# Patient Record
Sex: Female | Born: 1980 | Race: Black or African American | Hispanic: No | Marital: Single
Health system: Southern US, Community
[De-identification: ages and names within clinical notes are randomized; demographics above are authoritative.]

---

## 2013-03-04 ENCOUNTER — Emergency Department: Payer: Self-pay | Admitting: Emergency Medicine

## 2013-03-04 LAB — BASIC METABOLIC PANEL
Anion Gap: 5 — ABNORMAL LOW (ref 7–16)
Calcium, Total: 9.1 mg/dL (ref 8.5–10.1)
Chloride: 103 mmol/L (ref 98–107)
Co2: 28 mmol/L (ref 21–32)
Creatinine: 0.54 mg/dL — ABNORMAL LOW (ref 0.60–1.30)
Osmolality: 271 (ref 275–301)
Sodium: 136 mmol/L (ref 136–145)

## 2013-03-04 LAB — CBC
HCT: 36.1 % (ref 35.0–47.0)
MCH: 26.2 pg (ref 26.0–34.0)
MCV: 80 fL (ref 80–100)
Platelet: 189 10*3/uL (ref 150–440)

## 2013-03-04 LAB — HCG, QUANTITATIVE, PREGNANCY: Beta Hcg, Quant.: 36971 m[IU]/mL — ABNORMAL HIGH

## 2013-03-04 LAB — URINALYSIS, COMPLETE
Bacteria: NONE SEEN
Ketone: NEGATIVE
Leukocyte Esterase: NEGATIVE
Protein: NEGATIVE
RBC,UR: 2 /HPF (ref 0–5)
Squamous Epithelial: 9

## 2013-03-04 LAB — WET PREP, GENITAL

## 2013-03-04 LAB — GC/CHLAMYDIA PROBE AMP

## 2013-03-09 ENCOUNTER — Emergency Department: Payer: Self-pay | Admitting: Emergency Medicine

## 2013-03-09 LAB — URINALYSIS, COMPLETE
Blood: NEGATIVE
Ketone: NEGATIVE
Ph: 5 (ref 4.5–8.0)
RBC,UR: <1 /HPF (ref 0–5)
Squamous Epithelial: 6
WBC UR: <1 /HPF (ref 0–5)

## 2013-03-09 LAB — HCG, QUANTITATIVE, PREGNANCY: Beta Hcg, Quant.: 64517 m[IU]/mL — ABNORMAL HIGH

## 2013-03-15 ENCOUNTER — Emergency Department: Payer: Self-pay | Admitting: Emergency Medicine

## 2013-03-15 LAB — URINALYSIS, COMPLETE
Bacteria: NONE SEEN
Ph: 6 (ref 4.5–8.0)
RBC,UR: 1 /HPF (ref 0–5)
Squamous Epithelial: 8
WBC UR: 1 /HPF (ref 0–5)

## 2013-03-15 LAB — CBC WITH DIFFERENTIAL/PLATELET
Basophil #: 0 10*3/uL (ref 0.0–0.1)
Eosinophil #: 0.2 10*3/uL (ref 0.0–0.7)
Eosinophil %: 3.9 %
HCT: 39.5 % (ref 35.0–47.0)
Lymphocyte #: 1.5 10*3/uL (ref 1.0–3.6)
Lymphocyte %: 26.6 %
MCH: 26.6 pg (ref 26.0–34.0)
MCHC: 33.2 g/dL (ref 32.0–36.0)
Monocyte #: 0.4 x10 3/mm (ref 0.2–0.9)
Monocyte %: 7.3 %
Neutrophil #: 3.4 10*3/uL (ref 1.4–6.5)
Neutrophil %: 61.7 %
Platelet: 211 10*3/uL (ref 150–440)
RDW: 13 % (ref 11.5–14.5)
WBC: 5.5 10*3/uL (ref 3.6–11.0)

## 2013-03-15 LAB — COMPREHENSIVE METABOLIC PANEL
Anion Gap: 6 — ABNORMAL LOW (ref 7–16)
BUN: 11 mg/dL (ref 7–18)
Calcium, Total: 9.7 mg/dL (ref 8.5–10.1)
Chloride: 100 mmol/L (ref 98–107)
Co2: 26 mmol/L (ref 21–32)
Creatinine: 0.57 mg/dL — ABNORMAL LOW (ref 0.60–1.30)
EGFR (African American): >60
Glucose: 75 mg/dL (ref 65–99)
Potassium: 3.7 mmol/L (ref 3.5–5.1)
SGOT(AST): 20 U/L (ref 15–37)
SGPT (ALT): 25 U/L (ref 12–78)
Sodium: 132 mmol/L — ABNORMAL LOW (ref 136–145)
Total Protein: 8.7 g/dL — ABNORMAL HIGH (ref 6.4–8.2)

## 2013-04-27 ENCOUNTER — Emergency Department: Payer: Self-pay | Admitting: Emergency Medicine

## 2013-04-27 LAB — URINALYSIS, COMPLETE
BACTERIA: NONE SEEN
Bilirubin,UR: NEGATIVE
Glucose,UR: NEGATIVE mg/dL (ref 0–75)
Ketone: NEGATIVE
Leukocyte Esterase: NEGATIVE
Nitrite: NEGATIVE
Ph: 6 (ref 4.5–8.0)
Protein: NEGATIVE
RBC,UR: 159 /HPF (ref 0–5)
Specific Gravity: 1.029 (ref 1.003–1.030)
Squamous Epithelial: <1

## 2013-04-27 LAB — CBC
HCT: 36.6 % (ref 35.0–47.0)
HGB: 11.9 g/dL — ABNORMAL LOW (ref 12.0–16.0)
MCH: 27.1 pg (ref 26.0–34.0)
MCHC: 32.6 g/dL (ref 32.0–36.0)
MCV: 83 fL (ref 80–100)
Platelet: 195 10*3/uL (ref 150–440)
RBC: 4.39 10*6/uL (ref 3.80–5.20)
RDW: 14.2 % (ref 11.5–14.5)
WBC: 5.4 10*3/uL (ref 3.6–11.0)

## 2013-04-27 LAB — HCG, QUANTITATIVE, PREGNANCY: BETA HCG, QUANT.: 2100 m[IU]/mL — AB

## 2013-04-30 ENCOUNTER — Ambulatory Visit: Payer: Self-pay | Admitting: Emergency Medicine

## 2013-04-30 LAB — CBC WITH DIFFERENTIAL/PLATELET
Basophil #: 0 10*3/uL (ref 0.0–0.1)
Basophil %: 0.6 %
Eosinophil #: 0.2 10*3/uL (ref 0.0–0.7)
Eosinophil %: 3.8 %
HCT: 34.9 % — ABNORMAL LOW (ref 35.0–47.0)
HGB: 11.6 g/dL — ABNORMAL LOW (ref 12.0–16.0)
Lymphocyte #: 1.4 10*3/uL (ref 1.0–3.6)
Lymphocyte %: 26.6 %
MCH: 27.7 pg (ref 26.0–34.0)
MCHC: 33.3 g/dL (ref 32.0–36.0)
MCV: 83 fL (ref 80–100)
Monocyte #: 0.4 x10 3/mm (ref 0.2–0.9)
Monocyte %: 7.6 %
Neutrophil #: 3.3 10*3/uL (ref 1.4–6.5)
Neutrophil %: 61.4 %
PLATELETS: 187 10*3/uL (ref 150–440)
RBC: 4.2 10*6/uL (ref 3.80–5.20)
RDW: 14.2 % (ref 11.5–14.5)
WBC: 5.4 10*3/uL (ref 3.6–11.0)

## 2013-04-30 LAB — BASIC METABOLIC PANEL
Anion Gap: 6 — ABNORMAL LOW (ref 7–16)
BUN: 13 mg/dL (ref 7–18)
CO2: 29 mmol/L (ref 21–32)
Calcium, Total: 9.1 mg/dL (ref 8.5–10.1)
Chloride: 103 mmol/L (ref 98–107)
Creatinine: 0.83 mg/dL (ref 0.60–1.30)
Glucose: 95 mg/dL (ref 65–99)
Osmolality: 276 (ref 275–301)
Potassium: 3.7 mmol/L (ref 3.5–5.1)
SODIUM: 138 mmol/L (ref 136–145)

## 2013-04-30 LAB — HCG, QUANTITATIVE, PREGNANCY: Beta Hcg, Quant.: 1460 m[IU]/mL — ABNORMAL HIGH

## 2013-05-05 LAB — PATHOLOGY REPORT

## 2013-08-13 ENCOUNTER — Emergency Department: Payer: Self-pay | Admitting: Emergency Medicine

## 2013-08-17 LAB — BETA STREP CULTURE(ARMC)

## 2014-07-09 NOTE — Op Note (Signed)
PATIENT NAME:  Jessica Hester, Jessica Hester MR#:  [id number] DATE OF BIRTH:  [birth year]  DATE OF PROCEDURE:  04/30/2013  PREOPERATIVE DIAGNOSIS: Incomplete abortion.   POSTOPERATIVE DIAGNOSIS:  Incomplete abortion.     OPERATION PERFORMED: Suction dilatation and curettage.   ANESTHESIA: Used general.   PREOPERATIVE ANTIBIOTICS: Doxycycline 100 mg.   DRAINS OR TUBES: None.   IMPLANTS: None.   ESTIMATED BLOOD LOSS: 50  mL.   OPERATIVE FLUIDS: 300 mL of crystalloid.   URINE OUTPUT: 15 mL of urine straight cath'd at the beginning of the case.   COMPLICATIONS: None.   SPECIMENS REMOVED: Products of conception.   INTRAOPERATIVE FINDINGS: Retroverted 8 week size uterus, size 8 flexible suction curette was used with good uterine cry noted throughout on sharp curettage.   CONDITION FOLLOWING PROCEDURE: Stable.   PROCEDURE IN DETAIL: Risks, benefits and alternatives of the procedure were discussed with the patient prior to proceeding to the operating room. The patient was taken to the operating room and was placed under general anesthesia. She was positioned in the dorsal lithotomy position using Allen stirrups, prepped and draped in the usual sterile fashion. A timeout was performed. Attention was turned to the patient's pelvis. The bladder was straight cath'd with a red rubber catheter for 15 mL of clear urine. An operative speculum was then placed. The anterior lip of the cervix was grasped with a single-tooth tenaculum, and the cervix was sequentially dilated using Pratt dilators to allow passage of a size 8 flexible suction curette. Several passes of a size 8 suction curette were undertaken. Following this, sharp curettage was performed noting good uterine cry throughout. A final pass with the suction curette was then performed. The single-tooth tenaculum was removed. The tenaculum site and cervix were noted to be hemostatic. The operative speculum was removed. Sponge, needle and instrument counts were  correct x 2. The patient tolerated the procedure well and was taken to the recovery room in stable condition.    ____________________________ Florina OuAndreas M. Bonney AidStaebler, MD ams:dmm D: 05/04/2013 21:09:24 ET T: 05/04/2013 21:31:00 ET JOB#: [id number]  cc: Florina OuAndreas M. Bonney AidStaebler, MD, <Dictator> Carmel SacramentoANDREAS Cathrine MusterM Pearlina Friedly MD ELECTRONICALLY SIGNED 05/05/2013 1:00

## 2014-07-23 IMAGING — US US OB < 14 WEEKS - US OB TV
1 series · 14 of 28 positions shown · non-contrast
Comparison: 04/27/2013.

CLINICAL DATA: Pelvic pain.  Recent fetal demise.

EXAM:
OBSTETRIC <14 WK US AND TRANSVAGINAL OB US
TECHNIQUE: Both transabdominal and transvaginal ultrasound examinations were
performed for complete evaluation of the gestation as well as the
maternal uterus, adnexal regions, and pelvic cul-de-sac.
Transvaginal technique was performed to assess early pregnancy.

[Series 1: us ob < 14 weeks - us ob tv · 0.26mm/px · 14 of 52 slices shown]
[im 2/52]
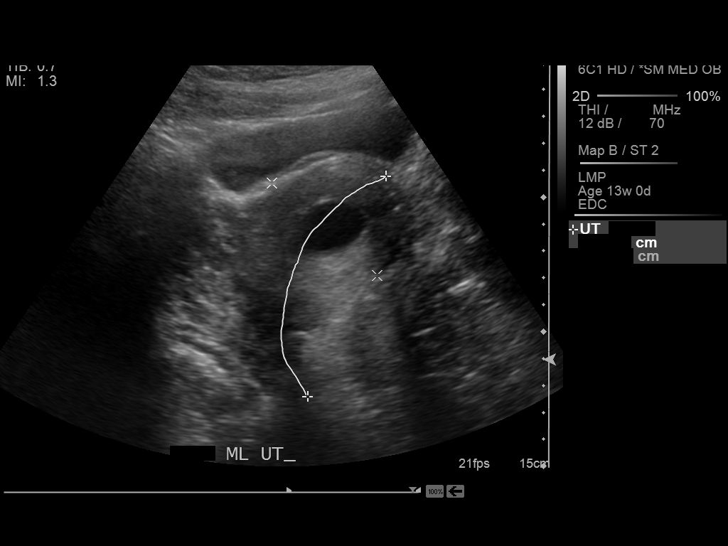
[im 6/52]
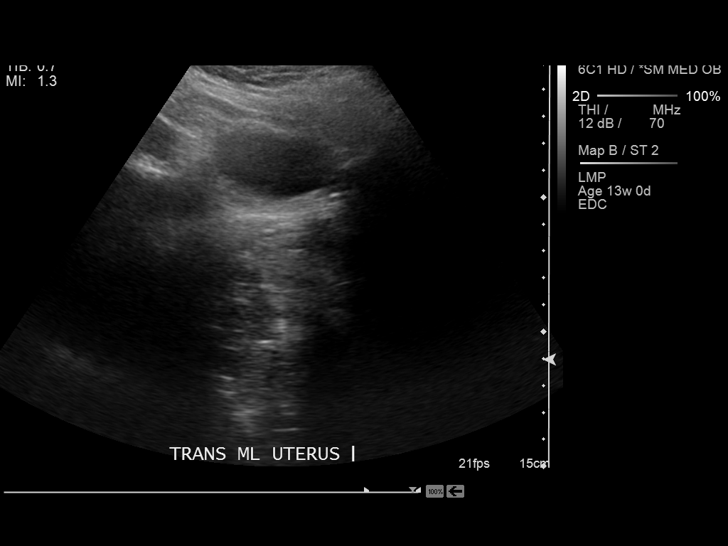
[im 10/52]
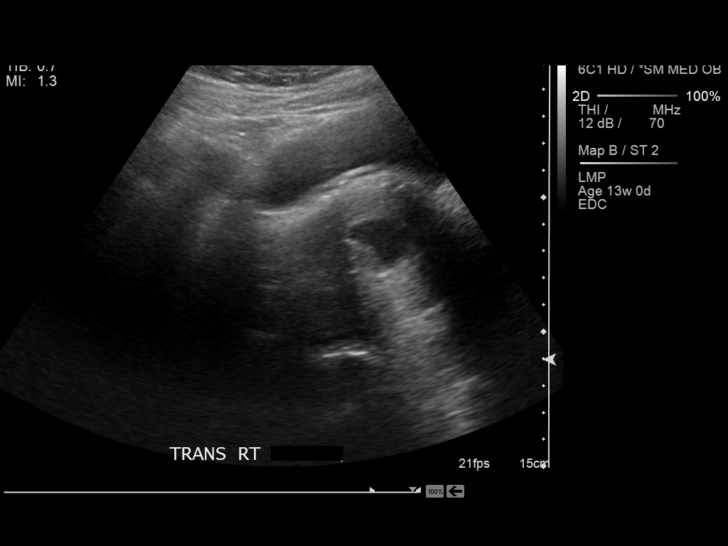
[im 14/52]
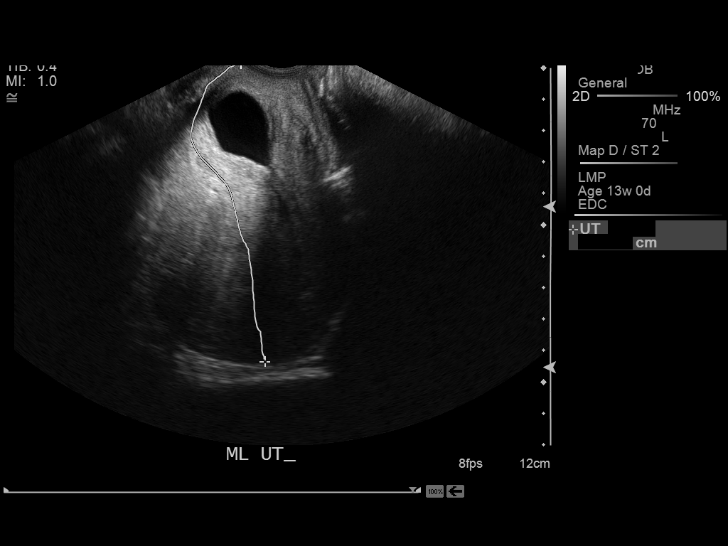
[im 18/52]
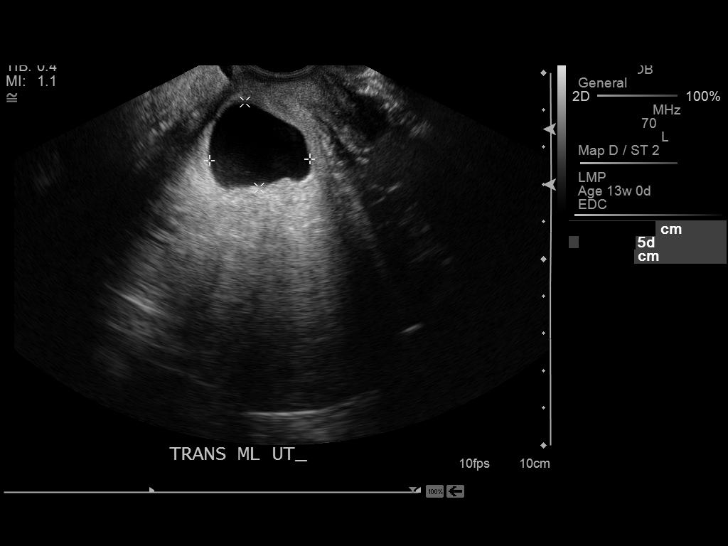
[im 21/52]
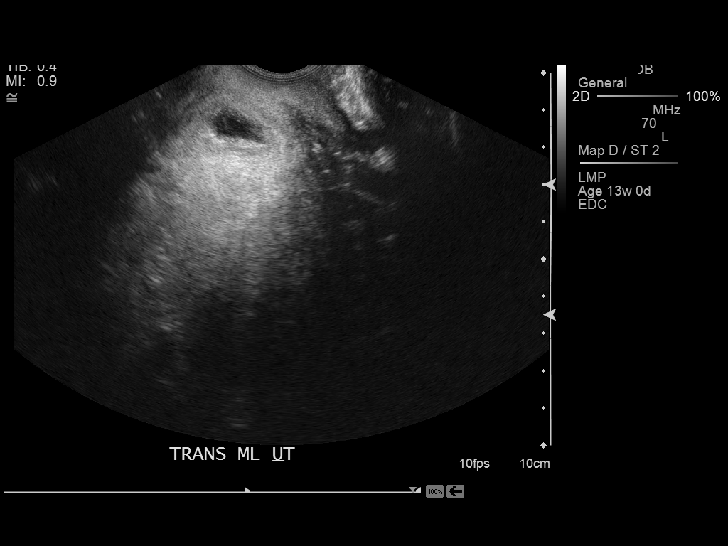
[im 25/52]
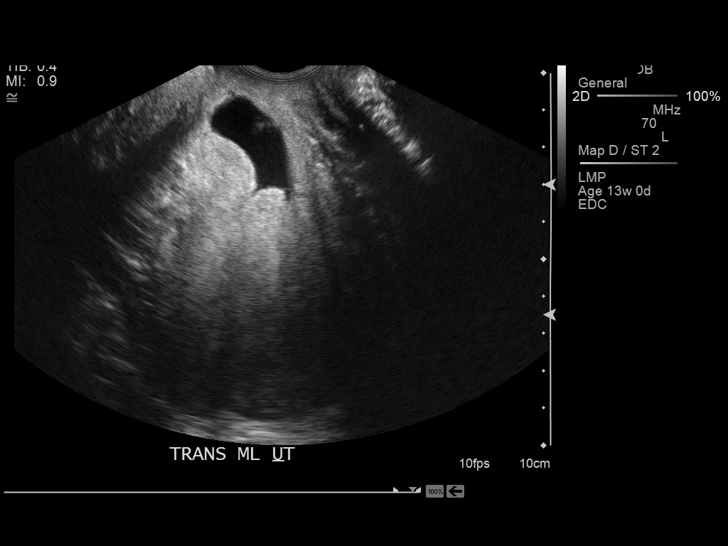
[im 29/52]
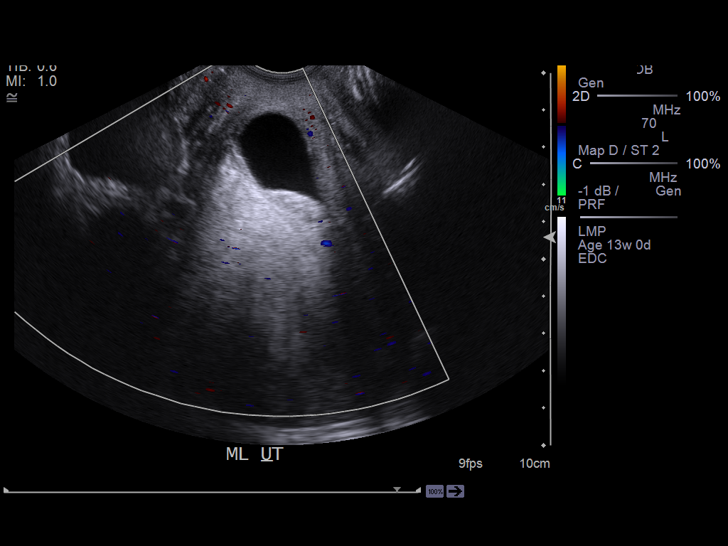
[im 33/52]
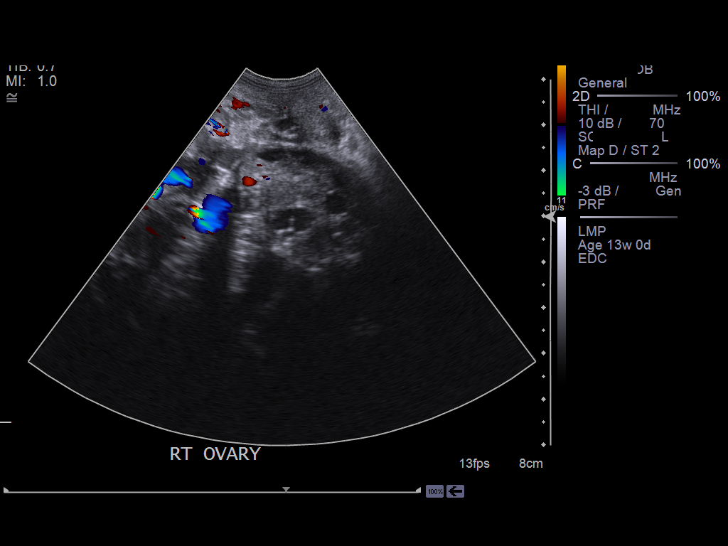
[im 36/52]
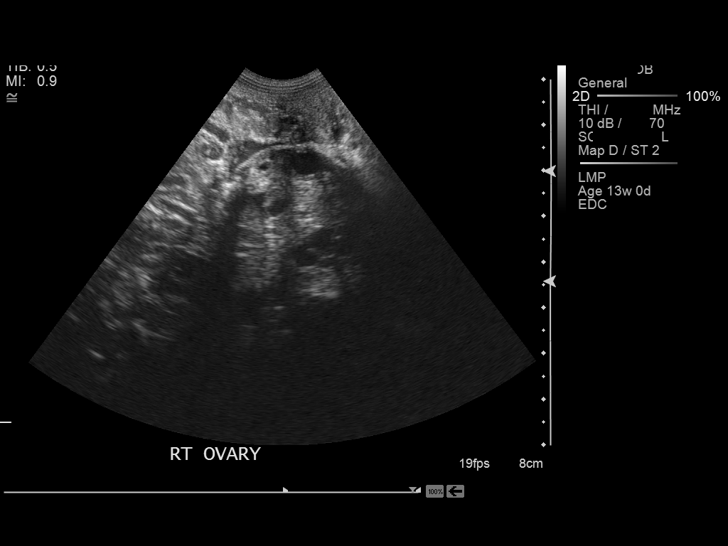
[im 40/52]
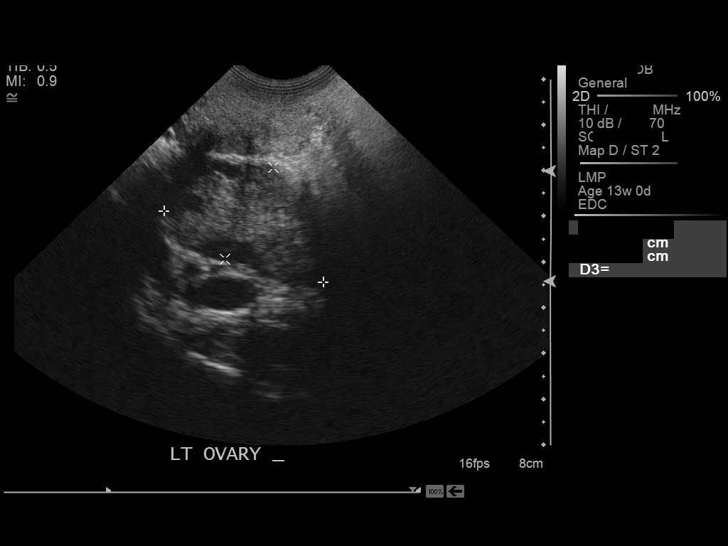
[im 44/52]
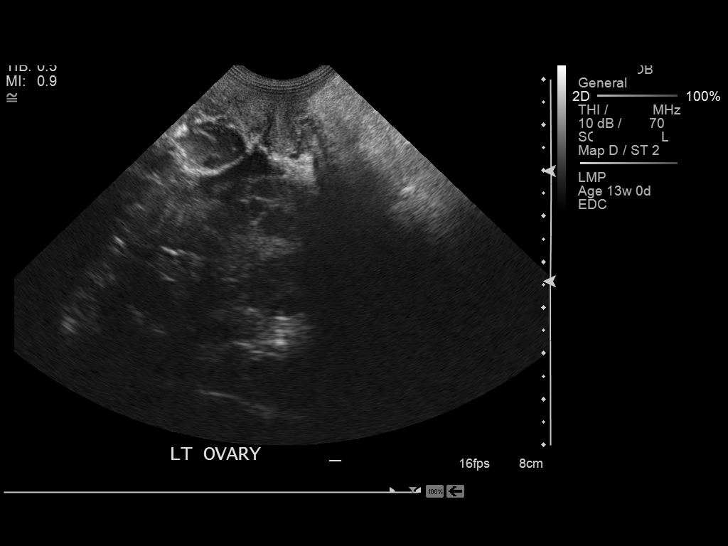
[im 48/52]
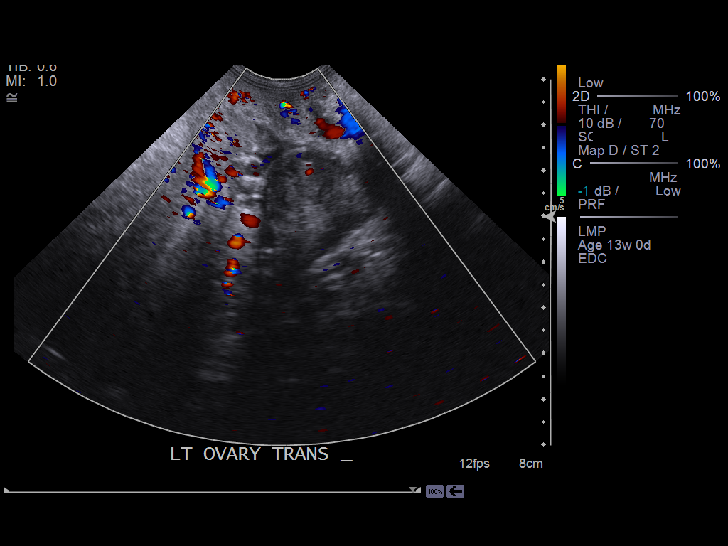
[im 52/52]
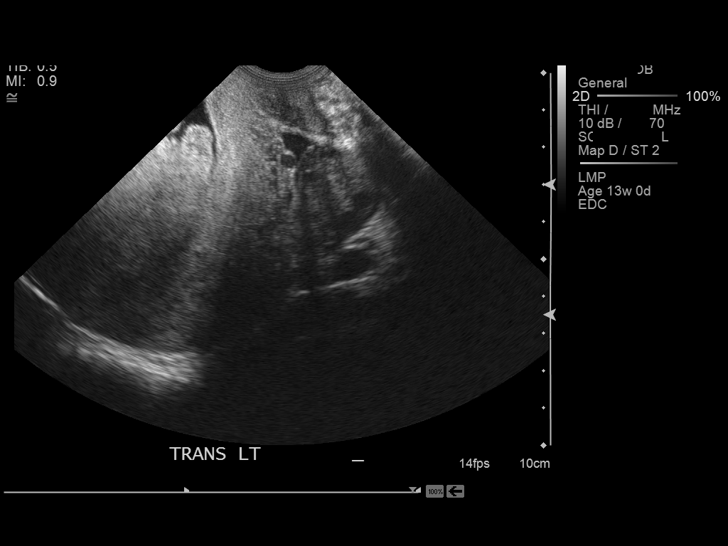

[14 of 28 positions shown; findings below may reference images not displayed]

FINDINGS: Irregular gestational sac in the lower uterine segment. No yolk sac
or fetal pole is visualized.

No subcortical hemorrhage.

Both ovaries are normal.
IMPRESSION: The irregular gestational sac has moved down into the lower uterine
segment. No fetal pole or yolk sac.

## 2014-07-26 NOTE — H&P (Signed)
L&D Evaluation:  History:  HPI 34 yo G2P0010 presenting to the ER with incomplete abortion.  The patient saw me as an ER follow yesterday for what appears to be a missed abortion vs blighted ovum. Per her LMP she should be 12 weeks, but US only reveals a 3mm mass either irregular yolk sac or fetal pole.  She decided to persue medical managment and was to take misoprostil po, did not take this as she started having cramping and bleeding last night.  US was scheduled for follow to verify completion but patient has vasovagal episode at work and cam to ER.  H&H stable, US shows GS in lower uterine segement.  Rh positive rhogam not indicated   Presents with bleeding   Patient's Surgical History D&C   Allergies NKDA   Social History none   Family History Non-Contributory   ROS:  ROS All systems were reviewed.  HEENT, CNS, GI, GU, Respiratory, CV, Renal and Musculoskeletal systems were found to be normal.   Exam:  Vital Signs stable   Urine Protein not completed   General no apparent distress   Mental Status clear   Chest clear   Heart normal sinus rhythm   Abdomen NABS, soft, non-distended non-tender   Edema no edema   Impression:  Impression Incomplete abortion   Plan:  Comments 1) Missed abortion      - Consent and admit for suction D&C     - Rh positive rhogam not indicated     - doxycyline 100mg  preoperatively     - SCD's     - reports has been NPO since last night   Electronic Signatures: Jessica Hester, Jessica Hester (MD)  (Signed 13-Feb-15 12:38)  Authored: L&D Evaluation   Last Updated: 13-Feb-15 12:38 by Jessica Hester, Nicole Hafley Hester (MD)
